# Patient Record
Sex: Female | Born: 1960 | Race: White | Hispanic: No | Marital: Married | Smoking: Never smoker
Health system: Southern US, Community
[De-identification: ages and names within clinical notes are randomized; demographics above are authoritative.]

## PROBLEM LIST (undated history)

## (undated) DIAGNOSIS — F419 Anxiety disorder, unspecified: Secondary | ICD-10-CM

## (undated) DIAGNOSIS — Z780 Asymptomatic menopausal state: Secondary | ICD-10-CM

## (undated) DIAGNOSIS — R001 Bradycardia, unspecified: Secondary | ICD-10-CM

## (undated) DIAGNOSIS — I452 Bifascicular block: Secondary | ICD-10-CM

## (undated) DIAGNOSIS — I4510 Unspecified right bundle-branch block: Secondary | ICD-10-CM

## (undated) HISTORY — DX: Asymptomatic menopausal state: Z78.0

## (undated) HISTORY — DX: Bifascicular block: I45.2

## (undated) HISTORY — DX: Unspecified right bundle-branch block: I45.10

## (undated) HISTORY — DX: Anxiety disorder, unspecified: F41.9

## (undated) HISTORY — DX: Bradycardia, unspecified: R00.1

---

## 1998-06-14 ENCOUNTER — Other Ambulatory Visit: Admission: RE | Admit: 1998-06-14 | Discharge: 1998-06-14 | Payer: Self-pay | Admitting: Obstetrics and Gynecology

## 1999-07-25 ENCOUNTER — Other Ambulatory Visit: Admission: RE | Admit: 1999-07-25 | Discharge: 1999-07-25 | Payer: Self-pay | Admitting: Obstetrics and Gynecology

## 1999-08-01 ENCOUNTER — Encounter: Payer: Self-pay | Admitting: Family Medicine

## 1999-08-01 ENCOUNTER — Encounter: Admission: RE | Admit: 1999-08-01 | Discharge: 1999-08-01 | Payer: Self-pay | Admitting: Family Medicine

## 2000-07-18 ENCOUNTER — Encounter: Admission: RE | Admit: 2000-07-18 | Discharge: 2000-07-18 | Payer: Self-pay | Admitting: Obstetrics and Gynecology

## 2000-07-18 ENCOUNTER — Encounter: Payer: Self-pay | Admitting: Obstetrics and Gynecology

## 2000-09-19 ENCOUNTER — Other Ambulatory Visit: Admission: RE | Admit: 2000-09-19 | Discharge: 2000-09-19 | Payer: Self-pay | Admitting: Obstetrics and Gynecology

## 2002-05-20 ENCOUNTER — Encounter: Admission: RE | Admit: 2002-05-20 | Discharge: 2002-05-20 | Payer: Self-pay | Admitting: Obstetrics and Gynecology

## 2002-05-20 ENCOUNTER — Encounter: Payer: Self-pay | Admitting: Obstetrics and Gynecology

## 2002-06-01 ENCOUNTER — Other Ambulatory Visit: Admission: RE | Admit: 2002-06-01 | Discharge: 2002-06-01 | Payer: Self-pay | Admitting: Obstetrics and Gynecology

## 2003-06-15 ENCOUNTER — Other Ambulatory Visit: Admission: RE | Admit: 2003-06-15 | Discharge: 2003-06-15 | Payer: Self-pay | Admitting: Obstetrics and Gynecology

## 2003-10-22 ENCOUNTER — Encounter: Admission: RE | Admit: 2003-10-22 | Discharge: 2003-10-22 | Payer: Self-pay | Admitting: Obstetrics and Gynecology

## 2004-07-25 ENCOUNTER — Other Ambulatory Visit: Admission: RE | Admit: 2004-07-25 | Discharge: 2004-07-25 | Payer: Self-pay | Admitting: Obstetrics and Gynecology

## 2004-11-08 ENCOUNTER — Encounter: Admission: RE | Admit: 2004-11-08 | Discharge: 2004-11-08 | Payer: Self-pay | Admitting: Obstetrics and Gynecology

## 2005-08-23 ENCOUNTER — Other Ambulatory Visit: Admission: RE | Admit: 2005-08-23 | Discharge: 2005-08-23 | Payer: Self-pay | Admitting: Obstetrics and Gynecology

## 2005-11-26 ENCOUNTER — Encounter: Admission: RE | Admit: 2005-11-26 | Discharge: 2005-11-26 | Payer: Self-pay | Admitting: Obstetrics and Gynecology

## 2006-09-03 HISTORY — PX: VEIN SURGERY: SHX48

## 2006-12-03 ENCOUNTER — Encounter: Admission: RE | Admit: 2006-12-03 | Discharge: 2006-12-03 | Payer: Self-pay | Admitting: Obstetrics and Gynecology

## 2006-12-11 ENCOUNTER — Encounter: Admission: RE | Admit: 2006-12-11 | Discharge: 2006-12-11 | Payer: Self-pay | Admitting: Obstetrics and Gynecology

## 2007-04-23 ENCOUNTER — Encounter: Admission: RE | Admit: 2007-04-23 | Discharge: 2007-04-23 | Payer: Self-pay | Admitting: Obstetrics and Gynecology

## 2008-01-22 ENCOUNTER — Encounter: Admission: RE | Admit: 2008-01-22 | Discharge: 2008-01-22 | Payer: Self-pay | Admitting: Obstetrics and Gynecology

## 2009-01-24 ENCOUNTER — Encounter: Admission: RE | Admit: 2009-01-24 | Discharge: 2009-01-24 | Payer: Self-pay | Admitting: Student

## 2010-01-26 ENCOUNTER — Encounter: Admission: RE | Admit: 2010-01-26 | Discharge: 2010-01-26 | Payer: Self-pay | Admitting: Obstetrics and Gynecology

## 2010-09-24 ENCOUNTER — Encounter: Payer: Self-pay | Admitting: Obstetrics and Gynecology

## 2010-12-06 ENCOUNTER — Other Ambulatory Visit: Payer: Self-pay | Admitting: Obstetrics and Gynecology

## 2010-12-06 DIAGNOSIS — Z1231 Encounter for screening mammogram for malignant neoplasm of breast: Secondary | ICD-10-CM

## 2011-01-31 ENCOUNTER — Ambulatory Visit
Admission: RE | Admit: 2011-01-31 | Discharge: 2011-01-31 | Disposition: A | Discharge status: Home / Self Care | Payer: 59 | Source: Ambulatory Visit | Attending: Obstetrics and Gynecology | Admitting: Obstetrics and Gynecology

## 2011-01-31 DIAGNOSIS — Z1231 Encounter for screening mammogram for malignant neoplasm of breast: Secondary | ICD-10-CM

## 2011-11-27 ENCOUNTER — Other Ambulatory Visit: Payer: Self-pay | Admitting: Obstetrics and Gynecology

## 2011-11-27 DIAGNOSIS — Z1231 Encounter for screening mammogram for malignant neoplasm of breast: Secondary | ICD-10-CM

## 2012-02-07 ENCOUNTER — Ambulatory Visit
Admission: RE | Admit: 2012-02-07 | Discharge: 2012-02-07 | Disposition: A | Discharge status: Home / Self Care | Payer: No Typology Code available for payment source | Source: Ambulatory Visit | Attending: Obstetrics and Gynecology | Admitting: Obstetrics and Gynecology

## 2012-02-07 DIAGNOSIS — Z1231 Encounter for screening mammogram for malignant neoplasm of breast: Secondary | ICD-10-CM

## 2012-02-12 ENCOUNTER — Other Ambulatory Visit: Payer: Self-pay | Admitting: Obstetrics and Gynecology

## 2012-02-12 DIAGNOSIS — R928 Other abnormal and inconclusive findings on diagnostic imaging of breast: Secondary | ICD-10-CM

## 2012-02-21 ENCOUNTER — Ambulatory Visit
Admission: RE | Admit: 2012-02-21 | Discharge: 2012-02-21 | Disposition: A | Discharge status: Home / Self Care | Payer: No Typology Code available for payment source | Source: Ambulatory Visit | Attending: Obstetrics and Gynecology | Admitting: Obstetrics and Gynecology

## 2012-02-21 DIAGNOSIS — R928 Other abnormal and inconclusive findings on diagnostic imaging of breast: Secondary | ICD-10-CM

## 2012-09-09 ENCOUNTER — Other Ambulatory Visit: Payer: Self-pay | Admitting: Family Medicine

## 2012-09-09 DIAGNOSIS — F458 Other somatoform disorders: Secondary | ICD-10-CM

## 2012-09-15 ENCOUNTER — Ambulatory Visit
Admission: RE | Admit: 2012-09-15 | Discharge: 2012-09-15 | Disposition: A | Discharge status: Home / Self Care | Payer: No Typology Code available for payment source | Source: Ambulatory Visit | Attending: Family Medicine | Admitting: Family Medicine

## 2012-09-15 DIAGNOSIS — F458 Other somatoform disorders: Secondary | ICD-10-CM

## 2013-01-15 ENCOUNTER — Other Ambulatory Visit: Payer: Self-pay

## 2013-01-15 DIAGNOSIS — Z1231 Encounter for screening mammogram for malignant neoplasm of breast: Secondary | ICD-10-CM

## 2013-02-19 ENCOUNTER — Ambulatory Visit
Admission: RE | Admit: 2013-02-19 | Discharge: 2013-02-19 | Disposition: A | Discharge status: Home / Self Care | Payer: PRIVATE HEALTH INSURANCE | Source: Ambulatory Visit

## 2013-02-19 ENCOUNTER — Ambulatory Visit: Payer: No Typology Code available for payment source

## 2013-02-19 DIAGNOSIS — Z1231 Encounter for screening mammogram for malignant neoplasm of breast: Secondary | ICD-10-CM

## 2013-12-15 ENCOUNTER — Other Ambulatory Visit: Payer: Self-pay

## 2013-12-15 DIAGNOSIS — Z1231 Encounter for screening mammogram for malignant neoplasm of breast: Secondary | ICD-10-CM

## 2014-02-22 ENCOUNTER — Encounter (INDEPENDENT_AMBULATORY_CARE_PROVIDER_SITE_OTHER): Payer: Self-pay

## 2014-02-22 ENCOUNTER — Ambulatory Visit
Admission: RE | Admit: 2014-02-22 | Discharge: 2014-02-22 | Disposition: A | Discharge status: Home / Self Care | Payer: PRIVATE HEALTH INSURANCE | Source: Ambulatory Visit

## 2014-02-22 DIAGNOSIS — Z1231 Encounter for screening mammogram for malignant neoplasm of breast: Secondary | ICD-10-CM

## 2015-02-09 ENCOUNTER — Other Ambulatory Visit: Payer: Self-pay

## 2015-02-09 DIAGNOSIS — Z1231 Encounter for screening mammogram for malignant neoplasm of breast: Secondary | ICD-10-CM

## 2015-03-08 ENCOUNTER — Ambulatory Visit
Admission: RE | Admit: 2015-03-08 | Discharge: 2015-03-08 | Disposition: A | Discharge status: Home / Self Care | Payer: PRIVATE HEALTH INSURANCE | Source: Ambulatory Visit

## 2015-03-08 DIAGNOSIS — Z1231 Encounter for screening mammogram for malignant neoplasm of breast: Secondary | ICD-10-CM

## 2016-01-26 ENCOUNTER — Other Ambulatory Visit: Payer: Self-pay

## 2016-01-26 DIAGNOSIS — Z1231 Encounter for screening mammogram for malignant neoplasm of breast: Secondary | ICD-10-CM

## 2016-03-12 ENCOUNTER — Ambulatory Visit
Admission: RE | Admit: 2016-03-12 | Discharge: 2016-03-12 | Disposition: A | Discharge status: Home / Self Care | Payer: PRIVATE HEALTH INSURANCE | Source: Ambulatory Visit

## 2016-03-12 DIAGNOSIS — Z1231 Encounter for screening mammogram for malignant neoplasm of breast: Secondary | ICD-10-CM

## 2016-05-01 ENCOUNTER — Telehealth: Payer: Self-pay | Admitting: Cardiology

## 2016-05-01 NOTE — Telephone Encounter (Signed)
Received records from [facility] for appointment on 06/04/16 with Dr Hochrein.  Records given to N Hines (medical records) for Dr Hochrein's schedule on 06/04/16. lp °

## 2016-05-04 ENCOUNTER — Encounter: Payer: Self-pay | Admitting: Internal Medicine

## 2016-05-10 ENCOUNTER — Ambulatory Visit (INDEPENDENT_AMBULATORY_CARE_PROVIDER_SITE_OTHER): Payer: PRIVATE HEALTH INSURANCE | Admitting: Internal Medicine

## 2016-05-10 VITALS — BP 126/78 | HR 57 | Ht 65.5 in | Wt 120.6 lb

## 2016-05-10 DIAGNOSIS — I495 Sick sinus syndrome: Secondary | ICD-10-CM | POA: Diagnosis not present

## 2016-05-10 NOTE — Progress Notes (Signed)
      HPI [name] is referred today for evaluation of sinus node dysfunction and RBBB. She is well and healthy and has no family h/o needing a PPM. She has never had syncope and has only rare palpitations. She has noted that her resting HR is low and in her primary MD's office was 46/min. She works out several times a week and has not noted any changes in her ability to exercise.  Allergies  Allergen Reactions  . Rabies Vaccines Anaphylaxis     Current Outpatient Prescriptions  Medication Sig Dispense Refill  . ALPRAZolam (XANAX) 0.5 MG tablet Take 0.5 mg by mouth 3 (three) times daily as needed for anxiety.    . Multiple Vitamin (MULTIVITAMIN) tablet Take 1 tablet by mouth daily.     No current facility-administered medications for this visit.      Past Medical History:  Diagnosis Date  . Anxiety   . Bifascicular bundle branch block   . Bradycardia   . Post-menopausal   . RBBB     ROS:   All systems reviewed and negative except as noted in the HPI.   Past Surgical History:  Procedure Laterality Date  . CESAREAN SECTION    . VEIN SURGERY  2008     Family History  Problem Relation Age of Onset  . Skin cancer Father   . Multiple myeloma Father   . Prostate cancer Father      Social History   Social History  . Marital status: Married    Spouse name: N/A  . Number of children: N/A  . Years of education: N/A   Occupational History  . Not on file.   Social History Main Topics  . Smoking status: Never Smoker  . Smokeless tobacco: Never Used  . Alcohol use 0.6 - 1.2 oz/week    1 - 2 Glasses of wine per week  . Drug use: No  . Sexual activity: Not on file   Other Topics Concern  . Not on file   Social History Narrative  . No narrative on file     BP 126/78   Pulse (!) 57   Ht 5' 5.5" (1.664 m)   Wt 120 lb 9.6 oz (54.7 kg)   BMI 19.76 kg/m   Physical Exam:  Well appearing middle aged woman, NAD HEENT: Unremarkable Neck:  No JVD, no  thyromegally Lymphatics:  No adenopathy Back:  No CVA tenderness Lungs:  Clear HEART:  Regular rate rhythm, no murmurs, no rubs, no clicks Abd:  soft, positive bowel sounds, no organomegally, no rebound, no guarding Ext:  2 plus pulses, no edema, no cyanosis, no clubbing Skin:  No rashes no nodules Neuro:  CN II through XII intact, motor grossly intact  EKG - sinus brady with RBBB and left axis.  Assess/Plan: 1. Sinus bradycardia/RBBB/left axis - the patient has evidence of conduction system disease but is asymptomatic. I have recommended she undergo exercise testing ( to assess her maximal HR and to evaluated AV conduction) and watchful waiting. We discussed symptoms suggestive of progression of disease - syncope, dizziness, inability to exercise. We will see her back based on her exercise test. I am not inclined to do additional testing at this point - Holter or echo as she is not symptomatic.   Mikle Bosworth.D.

## 2016-05-10 NOTE — Patient Instructions (Signed)
Medication Instructions:  Your physician recommends that you continue on your current medications as directed. Please refer to the Current Medication list given to you today.  Labwork: None Ordered    Testing/Procedures: Your physician has requested that you have an exercise tolerance test. For further information please visit [email or web address]. Please also follow instruction sheet, as given.     Follow-Up: Your physician recommends that you schedule a follow-up appointment on day of treadmill.    Any Other Special Instructions Will Be Listed Below (If Applicable).     If you need a refill on your cardiac medications before your next appointment, please call your pharmacy.

## 2016-05-22 ENCOUNTER — Ambulatory Visit: Payer: PRIVATE HEALTH INSURANCE | Admitting: Internal Medicine

## 2016-05-22 ENCOUNTER — Ambulatory Visit (INDEPENDENT_AMBULATORY_CARE_PROVIDER_SITE_OTHER): Payer: PRIVATE HEALTH INSURANCE

## 2016-05-22 DIAGNOSIS — I495 Sick sinus syndrome: Secondary | ICD-10-CM

## 2016-06-03 LAB — EXERCISE TOLERANCE TEST
CHL CUP MPHR: 165 {beats}/min
CHL CUP RESTING HR STRESS: 47 {beats}/min
CSEPPHR: 130 {beats}/min
Estimated workload: 13.7 METS
Exercise duration (min): 12 min
Exercise duration (sec): 0 s
Percent HR: 78 %
RPE: 15

## 2016-06-04 ENCOUNTER — Ambulatory Visit: Payer: PRIVATE HEALTH INSURANCE | Admitting: Cardiology

## 2017-02-25 ENCOUNTER — Other Ambulatory Visit: Payer: Self-pay | Admitting: Obstetrics and Gynecology

## 2017-02-25 DIAGNOSIS — Z1231 Encounter for screening mammogram for malignant neoplasm of breast: Secondary | ICD-10-CM

## 2017-03-22 ENCOUNTER — Ambulatory Visit
Admission: RE | Admit: 2017-03-22 | Discharge: 2017-03-22 | Disposition: A | Discharge status: Home / Self Care | Payer: PRIVATE HEALTH INSURANCE | Source: Ambulatory Visit | Attending: Obstetrics and Gynecology | Admitting: Obstetrics and Gynecology

## 2017-03-22 DIAGNOSIS — Z1231 Encounter for screening mammogram for malignant neoplasm of breast: Secondary | ICD-10-CM

## 2018-02-14 ENCOUNTER — Other Ambulatory Visit: Payer: Self-pay | Admitting: Obstetrics and Gynecology

## 2018-02-14 DIAGNOSIS — Z1231 Encounter for screening mammogram for malignant neoplasm of breast: Secondary | ICD-10-CM

## 2018-04-02 ENCOUNTER — Encounter: Payer: Self-pay | Admitting: Radiology

## 2018-04-02 ENCOUNTER — Ambulatory Visit
Admission: RE | Admit: 2018-04-02 | Discharge: 2018-04-02 | Disposition: A | Discharge status: Home / Self Care | Payer: PRIVATE HEALTH INSURANCE | Source: Ambulatory Visit | Attending: Obstetrics and Gynecology | Admitting: Obstetrics and Gynecology

## 2018-04-02 DIAGNOSIS — Z1231 Encounter for screening mammogram for malignant neoplasm of breast: Secondary | ICD-10-CM

## 2018-08-15 ENCOUNTER — Encounter (INDEPENDENT_AMBULATORY_CARE_PROVIDER_SITE_OTHER): Payer: Self-pay | Admitting: Orthopaedic Surgery

## 2018-08-15 ENCOUNTER — Ambulatory Visit (INDEPENDENT_AMBULATORY_CARE_PROVIDER_SITE_OTHER): Payer: PRIVATE HEALTH INSURANCE

## 2018-08-15 ENCOUNTER — Ambulatory Visit (INDEPENDENT_AMBULATORY_CARE_PROVIDER_SITE_OTHER): Payer: PRIVATE HEALTH INSURANCE | Admitting: Orthopaedic Surgery

## 2018-08-15 DIAGNOSIS — M79604 Pain in right leg: Secondary | ICD-10-CM

## 2018-08-15 NOTE — Progress Notes (Signed)
Office Visit Note   Patient: Megan Neal           Date of Birth: 09/01/1961           MRN: [id number] Visit Date: 08/15/2018              Requested by: Gaynelle Arabian, MD [address] [place], [place] [address] PCP: Gaynelle Arabian, MD   Assessment & Plan: Visit Diagnoses:  1. Pain in right leg     Plan: Impression is right lower leg bursitis and overuse doubt stress fracture and exertional compartment syndrome.  I recommend relative rest and nonimpact exercises as well as ice and heat and NSAIDs as needed.  I recommend avoidance of running for a month and then she may resume as tolerated.  If she does not improve from these measures we may need to consider advanced imaging.  Questions encouraged and answered.  She will be in touch with [place] if she does not improve.  Follow-Up Instructions: Return if symptoms worsen or fail to improve.   Orders:  Orders Placed This Encounter  Procedures  . XR Tibia/Fibula Right   No orders of the defined types were placed in this encounter.     Procedures: No procedures performed   Clinical Data: No additional findings.   Subjective: Chief Complaint  Patient presents with  . Right Leg - Pain    Megan Neal is a very pleasant 57 year old female who is the wife of Dr. Erline Levine who comes in with 1 to 2 weeks of right lower leg pain and mild swelling.  She has been running about 3 to 4 miles exercise on a regular basis.  The pain is mainly localized to the midshaft of the anterior medial aspect of the lower leg.  She states that the pain waxes and wanes and does not necessarily increase with increased activity and running.  She denies any numbness and tingling and denies any pain along the muscular compartments.  She states that originally her calf was painful but the pain migrated to the anterior medial aspect of the tibia.  She does notice some associated swelling in this area and some tenderness to palpation.  Rest does  improve the pain and running does make it worse.  Spitting and elliptical do not cause any pain.   Review of Systems  Constitutional: Negative.   HENT: Negative.   Eyes: Negative.   Respiratory: Negative.   Cardiovascular: Negative.   Endocrine: Negative.   Musculoskeletal: Negative.   Neurological: Negative.   Hematological: Negative.   Psychiatric/Behavioral: Negative.   All other systems reviewed and are negative.    Objective: Vital Signs: LMP 01/16/2011   Physical Exam Vitals signs and nursing note reviewed.  Constitutional:      Appearance: She is well-developed.  HENT:     Head: Normocephalic and atraumatic.  Neck:     Musculoskeletal: Neck supple.  Pulmonary:     Effort: Pulmonary effort is normal.  Abdominal:     Palpations: Abdomen is soft.  Skin:    General: Skin is warm.     Capillary Refill: Capillary refill takes less than 2 seconds.  Neurological:     Mental Status: She is alert and oriented to person, place, and time.  Psychiatric:        Behavior: Behavior normal.        Thought Content: Thought content normal.        Judgment: Judgment normal.     Ortho  Exam Right lower leg exam shows normal range of motion of the knee and ankle without pain.  Her muscular compartments are unremarkable.  Her pain and mild swelling are limited to the midshaft region of the anteromedial aspect of the tibia.  Her tibial crest is nontender. Specialty Comments:  No specialty comments available.  Imaging: Xr Tibia/fibula Right  Result Date: 08/15/2018 No acute or structural abnormalities.    PMFS History: Patient Active Problem List   Diagnosis Date Noted  . Sinus node dysfunction ([place]) 05/10/2016   Past Medical History:  Diagnosis Date  . Anxiety   . Bifascicular bundle branch block   . Bradycardia   . Post-menopausal   . RBBB     Family History  Problem Relation Age of Onset  . Skin cancer Father   . Multiple myeloma Father   . Prostate cancer  Father   . Breast cancer Maternal Aunt   . Breast cancer Maternal Aunt   . Breast cancer Maternal Aunt     Past Surgical History:  Procedure Laterality Date  . CESAREAN SECTION    . VEIN SURGERY  2008   Social History   Occupational History  . Not on file  Tobacco Use  . Smoking status: Never Smoker  . Smokeless tobacco: Never Used  Substance and Sexual Activity  . Alcohol use: Yes    Alcohol/week: 1.0 - 2.0 standard drinks    Types: 1 - 2 Glasses of wine per week  . Drug use: No  . Sexual activity: Not on file

## 2019-02-11 ENCOUNTER — Ambulatory Visit (INDEPENDENT_AMBULATORY_CARE_PROVIDER_SITE_OTHER): Payer: PRIVATE HEALTH INSURANCE

## 2019-02-11 ENCOUNTER — Encounter: Payer: Self-pay | Admitting: Orthopaedic Surgery

## 2019-02-11 ENCOUNTER — Ambulatory Visit (INDEPENDENT_AMBULATORY_CARE_PROVIDER_SITE_OTHER): Payer: PRIVATE HEALTH INSURANCE | Admitting: Orthopaedic Surgery

## 2019-02-11 ENCOUNTER — Other Ambulatory Visit: Payer: Self-pay

## 2019-02-11 DIAGNOSIS — S62347A Nondisplaced fracture of base of fifth metacarpal bone. left hand, initial encounter for closed fracture: Secondary | ICD-10-CM | POA: Diagnosis not present

## 2019-02-11 DIAGNOSIS — M79642 Pain in left hand: Secondary | ICD-10-CM | POA: Diagnosis not present

## 2019-02-11 NOTE — Progress Notes (Signed)
Office Visit Note   Patient: Megan Neal           Date of Birth: 04-Sep-1960           MRN: [id number] Visit Date: 02/11/2019              Requested by: Gaynelle Arabian, MD [address] [place], [place] [address] PCP: Gaynelle Arabian, MD   Assessment & Plan: Visit Diagnoses:  1. Nondisplaced fracture of base of fifth metacarpal bone, left hand, initial encounter for closed fracture     Plan: Impression is a minimally displaced base of fifth metacarpal fracture.  X-rays were reviewed with the patient today and overall the alignment is within acceptable limits and this should heal predictably over the next 8 to 12 weeks.  We will place her in a removable ulnar gutter brace for the next 4 weeks.  We will see her back in 4 weeks with three-view x-rays of the left hand.  Should she need stronger pain medicines for breakthrough pain she is welcome to call us at any time I am happy to send some in for her.  Questions encouraged and answered.  Follow-Up Instructions: Return in about 4 weeks (around 03/11/2019).   Orders:  Orders Placed This Encounter  Procedures  . XR Hand Complete Left   No orders of the defined types were placed in this encounter.     Procedures: No procedures performed   Clinical Data: No additional findings.   Subjective: Chief Complaint  Patient presents with  . Left Hand - Pain    Megan Neal is a 58 year old female who is the wife of Erline Levine who comes in for evaluation of her acute left hand injury that she sustained yesterday evening and she had a mechanical fall during her walk in the neighborhood.  She did hit her shoulder and right hand and her head but she is doing fine from this and has mainly complaints the left hand.  She does have swelling and bruising to the left hand.  She has been taking over-the-counter medicines only.   Review of Systems  Constitutional: Negative.   HENT: Negative.   Eyes: Negative.   Respiratory:  Negative.   Cardiovascular: Negative.   Endocrine: Negative.   Musculoskeletal: Negative.   Neurological: Negative.   Hematological: Negative.   Psychiatric/Behavioral: Negative.   All other systems reviewed and are negative.    Objective: Vital Signs: LMP 01/16/2011   Physical Exam Vitals signs and nursing note reviewed.  Constitutional:      Appearance: She is well-developed.  HENT:     Head: Normocephalic and atraumatic.  Neck:     Musculoskeletal: Neck supple.  Pulmonary:     Effort: Pulmonary effort is normal.  Abdominal:     Palpations: Abdomen is soft.  Skin:    General: Skin is warm.     Capillary Refill: Capillary refill takes less than 2 seconds.  Neurological:     Mental Status: She is alert and oriented to person, place, and time.  Psychiatric:        Behavior: Behavior normal.        Thought Content: Thought content normal.        Judgment: Judgment normal.     Ortho Exam Left hand exam shows moderate swelling and ecchymosis.  No rotational deformity of the fifth ray.  No neurovascular compromise of the left hand.  She is tender at the base of the fifth metacarpal.  No gross  movement of the fifth CMC articulation. Specialty Comments:  No specialty comments available.  Imaging: Xr Hand Complete Left  Result Date: 02/11/2019 Minimally displaced base of 5th metacarpal fracture.  CMC articulation intact.    PMFS History: Patient Active Problem List   Diagnosis Date Noted  . Sinus node dysfunction ([place]) 05/10/2016   Past Medical History:  Diagnosis Date  . Anxiety   . Bifascicular bundle branch block   . Bradycardia   . Post-menopausal   . RBBB     Family History  Problem Relation Age of Onset  . Skin cancer Father   . Multiple myeloma Father   . Prostate cancer Father   . Breast cancer Maternal Aunt   . Breast cancer Maternal Aunt   . Breast cancer Maternal Aunt     Past Surgical History:  Procedure Laterality Date  . CESAREAN SECTION     . VEIN SURGERY  2008   Social History   Occupational History  . Not on file  Tobacco Use  . Smoking status: Never Smoker  . Smokeless tobacco: Never Used  Substance and Sexual Activity  . Alcohol use: Yes    Alcohol/week: 1.0 - 2.0 standard drinks    Types: 1 - 2 Glasses of wine per week  . Drug use: No  . Sexual activity: Not on file

## 2019-03-05 ENCOUNTER — Other Ambulatory Visit: Payer: Self-pay | Admitting: Obstetrics and Gynecology

## 2019-03-05 DIAGNOSIS — Z1231 Encounter for screening mammogram for malignant neoplasm of breast: Secondary | ICD-10-CM

## 2019-04-17 ENCOUNTER — Ambulatory Visit: Payer: PRIVATE HEALTH INSURANCE

## 2019-05-21 ENCOUNTER — Ambulatory Visit
Admission: RE | Admit: 2019-05-21 | Discharge: 2019-05-21 | Disposition: A | Discharge status: Home / Self Care | Payer: PRIVATE HEALTH INSURANCE | Source: Ambulatory Visit | Attending: Obstetrics and Gynecology | Admitting: Obstetrics and Gynecology

## 2019-05-21 ENCOUNTER — Other Ambulatory Visit: Payer: Self-pay

## 2019-05-21 DIAGNOSIS — Z1231 Encounter for screening mammogram for malignant neoplasm of breast: Secondary | ICD-10-CM

## 2020-05-17 ENCOUNTER — Other Ambulatory Visit: Payer: Self-pay | Admitting: Obstetrics and Gynecology

## 2020-05-17 DIAGNOSIS — Z1231 Encounter for screening mammogram for malignant neoplasm of breast: Secondary | ICD-10-CM

## 2020-05-27 ENCOUNTER — Ambulatory Visit
Admission: RE | Admit: 2020-05-27 | Discharge: 2020-05-27 | Disposition: A | Discharge status: Home / Self Care | Payer: PRIVATE HEALTH INSURANCE | Source: Ambulatory Visit | Attending: Obstetrics and Gynecology | Admitting: Obstetrics and Gynecology

## 2020-05-27 ENCOUNTER — Other Ambulatory Visit: Payer: Self-pay

## 2020-05-27 DIAGNOSIS — Z1231 Encounter for screening mammogram for malignant neoplasm of breast: Secondary | ICD-10-CM

## 2021-05-18 ENCOUNTER — Other Ambulatory Visit: Payer: Self-pay | Admitting: Obstetrics and Gynecology

## 2021-05-18 DIAGNOSIS — Z1231 Encounter for screening mammogram for malignant neoplasm of breast: Secondary | ICD-10-CM

## 2021-06-28 ENCOUNTER — Ambulatory Visit
Admission: RE | Admit: 2021-06-28 | Discharge: 2021-06-28 | Disposition: A | Discharge status: Home / Self Care | Payer: No Typology Code available for payment source | Source: Ambulatory Visit | Attending: Obstetrics and Gynecology | Admitting: Obstetrics and Gynecology

## 2021-06-28 ENCOUNTER — Other Ambulatory Visit: Payer: Self-pay

## 2021-06-28 DIAGNOSIS — Z1231 Encounter for screening mammogram for malignant neoplasm of breast: Secondary | ICD-10-CM

## 2022-05-09 ENCOUNTER — Other Ambulatory Visit: Payer: Self-pay | Admitting: Obstetrics and Gynecology

## 2022-05-09 ENCOUNTER — Other Ambulatory Visit: Payer: Self-pay | Admitting: Family Medicine

## 2022-05-09 DIAGNOSIS — Z1231 Encounter for screening mammogram for malignant neoplasm of breast: Secondary | ICD-10-CM

## 2022-06-23 DIAGNOSIS — Z23 Encounter for immunization: Secondary | ICD-10-CM | POA: Diagnosis not present

## 2022-07-09 ENCOUNTER — Ambulatory Visit: Payer: No Typology Code available for payment source

## 2022-07-16 ENCOUNTER — Ambulatory Visit
Admission: RE | Admit: 2022-07-16 | Discharge: 2022-07-16 | Disposition: A | Discharge status: Home / Self Care | Payer: BC Managed Care – PPO | Source: Ambulatory Visit | Attending: Obstetrics and Gynecology | Admitting: Obstetrics and Gynecology

## 2022-07-16 DIAGNOSIS — Z1231 Encounter for screening mammogram for malignant neoplasm of breast: Secondary | ICD-10-CM | POA: Diagnosis not present

## 2022-07-25 DIAGNOSIS — Z131 Encounter for screening for diabetes mellitus: Secondary | ICD-10-CM | POA: Diagnosis not present

## 2022-07-25 DIAGNOSIS — E785 Hyperlipidemia, unspecified: Secondary | ICD-10-CM | POA: Diagnosis not present

## 2022-07-25 DIAGNOSIS — Z0000 Encounter for general adult medical examination without abnormal findings: Secondary | ICD-10-CM | POA: Diagnosis not present

## 2022-10-10 DIAGNOSIS — Z01419 Encounter for gynecological examination (general) (routine) without abnormal findings: Secondary | ICD-10-CM | POA: Diagnosis not present

## 2022-10-10 DIAGNOSIS — Z1151 Encounter for screening for human papillomavirus (HPV): Secondary | ICD-10-CM | POA: Diagnosis not present

## 2022-10-10 DIAGNOSIS — Z6821 Body mass index (BMI) 21.0-21.9, adult: Secondary | ICD-10-CM | POA: Diagnosis not present

## 2022-10-10 DIAGNOSIS — Z124 Encounter for screening for malignant neoplasm of cervix: Secondary | ICD-10-CM | POA: Diagnosis not present

## 2022-11-22 DIAGNOSIS — Z13820 Encounter for screening for osteoporosis: Secondary | ICD-10-CM | POA: Diagnosis not present

## 2023-01-01 DIAGNOSIS — D225 Melanocytic nevi of trunk: Secondary | ICD-10-CM | POA: Diagnosis not present

## 2023-01-01 DIAGNOSIS — D2362 Other benign neoplasm of skin of left upper limb, including shoulder: Secondary | ICD-10-CM | POA: Diagnosis not present

## 2023-01-01 DIAGNOSIS — L821 Other seborrheic keratosis: Secondary | ICD-10-CM | POA: Diagnosis not present

## 2023-01-01 DIAGNOSIS — L578 Other skin changes due to chronic exposure to nonionizing radiation: Secondary | ICD-10-CM | POA: Diagnosis not present

## 2023-03-17 ENCOUNTER — Emergency Department (HOSPITAL_BASED_OUTPATIENT_CLINIC_OR_DEPARTMENT_OTHER): Payer: BC Managed Care – PPO | Admitting: Radiology

## 2023-03-17 ENCOUNTER — Emergency Department (HOSPITAL_BASED_OUTPATIENT_CLINIC_OR_DEPARTMENT_OTHER)
Admission: EM | Admit: 2023-03-17 | Discharge: 2023-03-17 | Disposition: A | Discharge status: Home / Self Care | Payer: BC Managed Care – PPO | Attending: Emergency Medicine | Admitting: Emergency Medicine

## 2023-03-17 ENCOUNTER — Other Ambulatory Visit: Payer: Self-pay

## 2023-03-17 ENCOUNTER — Encounter (HOSPITAL_BASED_OUTPATIENT_CLINIC_OR_DEPARTMENT_OTHER): Payer: Self-pay | Admitting: Emergency Medicine

## 2023-03-17 DIAGNOSIS — J168 Pneumonia due to other specified infectious organisms: Secondary | ICD-10-CM | POA: Insufficient documentation

## 2023-03-17 DIAGNOSIS — Z20822 Contact with and (suspected) exposure to covid-19: Secondary | ICD-10-CM | POA: Insufficient documentation

## 2023-03-17 DIAGNOSIS — J189 Pneumonia, unspecified organism: Secondary | ICD-10-CM

## 2023-03-17 DIAGNOSIS — R059 Cough, unspecified: Secondary | ICD-10-CM | POA: Diagnosis not present

## 2023-03-17 DIAGNOSIS — R918 Other nonspecific abnormal finding of lung field: Secondary | ICD-10-CM | POA: Diagnosis not present

## 2023-03-17 LAB — RESP PANEL BY RT-PCR (RSV, FLU A&B, COVID)  RVPGX2
Influenza A by PCR: NEGATIVE
Influenza B by PCR: NEGATIVE
Resp Syncytial Virus by PCR: NEGATIVE
SARS Coronavirus 2 by RT PCR: NEGATIVE

## 2023-03-17 MED ORDER — BENZONATATE 100 MG PO CAPS: 100.0000 mg | ORAL_CAPSULE | Freq: Three times a day (TID) | ORAL | 0 refills | Status: AC

## 2023-03-17 MED ORDER — AZITHROMYCIN 250 MG PO TABS: 250.0000 mg | ORAL_TABLET | Freq: Every day | ORAL | 0 refills | Status: AC

## 2023-03-17 NOTE — Discharge Instructions (Signed)
It was a pleasure taking care of you here in the emergency department  As discussed in the room your chest x-ray shows a right lower lobe pneumonia.    We have started you on antibiotic called Azithromycin.  Please fully complete this antibiotics until they are no more pills left in the prescription.    We have also prescribed you Tessalon Perles which will help with your cough.  May also use a humidifier at night to help with cough as well.  May take Tylenol or Motrin as needed for pain/fever  Make sure to follow-up with primary care provider in 1 week, if you are feeling worse after 2 days of antibiotics, start coughing up blood, unilateral leg swelling, chest pain, persistent high fever, nausea or vomiting please seek reevaluation emergency department

## 2023-03-17 NOTE — ED Triage Notes (Signed)
Pt arrives to ED with c/o cough x4 days. Was recently in [place] and arrived home x2 days ago. Associated with headache and fever.

## 2023-03-17 NOTE — ED Provider Notes (Signed)
[facility] AT [facility] Provider Note   CSN: [id number] Arrival date & time: 03/17/23  1245    History  Chief Complaint  Patient presents with   Cough    Megan Neal is a 62 y.o. female history of RBBB here for evaluation of cough.  Began 4 days ago.  Some hoarse voice.  Has had some night sweats, chills as well as headaches.  No rhinorrhea.  No neck rigidity.  No chest pain.  Has some shortness of breath when she coughs however no otherwise DOE.  No abdominal pain.  No nausea or vomiting.  No pain or swelling to lower legs.  No history of PE or DVT.  Did return 2 days ago from [place] however symptoms started prior to this according to husband.  She denies any hemoptysis, pleuritic chest pain.  Feels like when she has had pneumonia previously.  She has been visiting phone number in the hospital as well as nursing home.  HPI     Home Medications Prior to Admission medications   Medication Sig Start Date End Date Taking? Authorizing Provider  azithromycin (ZITHROMAX) 250 MG tablet Take 1 tablet (250 mg total) by mouth daily. Take first 2 tablets together, then 1 every day until finished. 03/17/23  Yes Charna Neeb A, PA-C  benzonatate (TESSALON) 100 MG capsule Take 1 capsule (100 mg total) by mouth every 8 (eight) hours. 03/17/23  Yes Marquisha Nikolov A, PA-C  ALPRAZolam (XANAX) 0.5 MG tablet Take 0.5 mg by mouth 3 (three) times daily as needed for anxiety.    [provider]  Multiple Vitamin (MULTIVITAMIN) tablet Take 1 tablet by mouth daily.    [provider]      Allergies    Rabies immune globulin (human) and Rabies vaccines    Review of Systems   Review of Systems  Constitutional:  Positive for chills and fatigue.  HENT:         Hoarse voice  Respiratory:  Positive for cough and shortness of breath (when coughing).   Cardiovascular: Negative.   Gastrointestinal: Negative.   Genitourinary: Negative.    Musculoskeletal: Negative.   Skin: Negative.   Neurological:  Positive for headaches.  All other systems reviewed and are negative.   Physical Exam Updated Vital Signs BP 122/70 (BP Location: Right Arm)   Pulse 67   Temp 97.9 F (36.6 C) (Oral)   Resp 16   Ht 5\' 5"  (1.651 m)   Wt 54.4 kg   LMP 01/16/2011   SpO2 98%   BMI 19.97 kg/m  Physical Exam Vitals and nursing note reviewed.  Constitutional:      General: She is not in acute distress.    Appearance: She is well-developed. She is not ill-appearing, toxic-appearing or diaphoretic.  HENT:     Head: Normocephalic and atraumatic.     Nose: Nose normal.     Mouth/Throat:     Lips: Pink.     Mouth: Mucous membranes are moist.     Pharynx: Oropharynx is clear. Uvula midline.     Comments: Posterior oropharynx clear.  Uvula midline.  No deviation.  No exudates.  No pooling of secretions Eyes:     Pupils: Pupils are equal, round, and reactive to light.  Cardiovascular:     Rate and Rhythm: Normal rate.     Pulses: Normal pulses.          Radial pulses are 2+ on the right side and 2+ on the  left side.     Heart sounds: Normal heart sounds.  Pulmonary:     Effort: Pulmonary effort is normal. No respiratory distress.     Breath sounds: Rhonchi present.     Comments: Rhonchi posterior right lower lobe.  No respiratory distress Chest:     Chest wall: No tenderness.  Abdominal:     General: Bowel sounds are normal. There is no distension.     Palpations: Abdomen is soft.     Tenderness: There is no abdominal tenderness.  Musculoskeletal:        General: No swelling, tenderness, deformity or signs of injury. Normal range of motion.     Cervical back: Normal range of motion.     Right lower leg: No edema.     Left lower leg: No edema.     Comments: Soft, nontender, Comparments soft  Skin:    General: Skin is warm and dry.     Capillary Refill: Capillary refill takes less than 2 seconds.  Neurological:     General: No  focal deficit present.     Mental Status: She is alert.     Gait: Gait normal.  Psychiatric:        Mood and Affect: Mood normal.     ED Results / Procedures / Treatments   Labs (all labs ordered are listed, but only abnormal results are displayed) Labs Reviewed  RESP PANEL BY RT-PCR (RSV, FLU A&B, COVID)  RVPGX2    EKG None  Radiology DG Chest 2 View  Result Date: 03/17/2023 CLINICAL DATA:  Cough x4 days EXAM: CHEST - 2 VIEW COMPARISON:  None Available. FINDINGS: Cardiac size is within normal limits. There are no signs of alveolar pulmonary edema. Patchy infiltrates are seen in posterior right lower lung fields suggesting right lower lobe pneumonia. There is no significant pleural effusion or pneumothorax. IMPRESSION: Moderate-sized patchy infiltrate is seen in right lower lung field consistent with pneumonia. Electronically Signed   By: Ernie Avena M.D.   On: 03/17/2023 13:50    Procedures Procedures    Medications Ordered in ED Medications - No data to display  ED Course/ Medical Decision Making/ A&P   This is a pleasant 62 year old here for evaluation of cough, chills, hoarse voice and headache which started 4 days ago.  Afebrile, nonseptic, not ill-appearing.  Rhonchi to right lower lobe.  No respiratory distress.  Her abdomen is soft, nontender.  Posterior pharynx clear.  No evidence of PTA or RPA.  Low suspicion for strep pharyngitis.  She moves her neck freely in the room.  Low suspicion for meningitis.  Of note she did recently return from [place] 2 days ago however according to patient and significant other in room her symptoms started prior to her travel.  She is no clinical evidence of VTE on exam, no pain, swelling, redness.  She has no hemoptysis, pleuritic chest pain, tachycardia, tachypnea or hypoxia. Given symptoms started prior to her travel I suspect this is most likely infectious in nature and I have low suspicion for pulmonary embolism as cause of her  cough.  She states symptoms today feel similar to when she has had pneumonia previously.  Labs and imaging personally viewed and interpreted:  Viral panel negative Chest x-ray with patchy lobe right lower lobe  I discussed results with patient, and significant other in room.  Will start on azithromycin for CAP.  She does have exposure to visiting someone in the hospital as well as a nursing home however  no inpatient hospitalization. Do not feel she needs HCAP coverage at this time. Low suspicion for aspiration pneumonia.  Will have her follow-up with PCP for reevaluation, return for new or worsening symptoms.  Discussed plan with patient, husband in room.  Agreeable for follow-up, return for new or worsening symptoms.  The patient has been appropriately medically screened and/or stabilized in the ED. I have low suspicion for any other emergent medical condition which would require further screening, evaluation or treatment in the ED or require inpatient management.  Patient is hemodynamically stable and in no acute distress.  Patient able to ambulate in department prior to ED.  Evaluation does not show acute pathology that would require ongoing or additional emergent interventions while in the emergency department or further inpatient treatment.  I have discussed the diagnosis with the patient and answered all questions.  Pain is been managed while in the emergency department and patient has no further complaints prior to discharge.  Patient is comfortable with plan discussed in room and is stable for discharge at this time.  I have discussed strict return precautions for returning to the emergency department.  Patient was encouraged to follow-up with PCP/specialist refer to at discharge.                             Medical Decision Making Amount and/or Complexity of Data Reviewed Independent Historian: spouse External Data Reviewed: labs, radiology and notes. Labs: ordered. Decision-making  details documented in ED Course. Radiology: ordered and independent interpretation performed. Decision-making details documented in ED Course.  Risk OTC drugs. Prescription drug management. Decision regarding hospitalization. Diagnosis or treatment significantly limited by social determinants of health.          Final Clinical Impression(s) / ED Diagnoses Final diagnoses:  Pneumonia of right lower lobe due to infectious organism    Rx / DC Orders ED Discharge Orders          Ordered    azithromycin (ZITHROMAX) 250 MG tablet  Daily        03/17/23 1425    benzonatate (TESSALON) 100 MG capsule  Every 8 hours        03/17/23 1425              Lorimer Tiberio A, PA-C 03/17/23 1526    Ernie Avena, MD 03/18/23 [id number]

## 2023-03-20 DIAGNOSIS — J189 Pneumonia, unspecified organism: Secondary | ICD-10-CM | POA: Diagnosis not present

## 2023-06-13 ENCOUNTER — Other Ambulatory Visit: Payer: Self-pay | Admitting: Obstetrics and Gynecology

## 2023-06-13 DIAGNOSIS — Z1231 Encounter for screening mammogram for malignant neoplasm of breast: Secondary | ICD-10-CM

## 2023-06-15 DIAGNOSIS — Z23 Encounter for immunization: Secondary | ICD-10-CM | POA: Diagnosis not present

## 2023-06-18 DIAGNOSIS — F418 Other specified anxiety disorders: Secondary | ICD-10-CM | POA: Diagnosis not present

## 2023-08-20 ENCOUNTER — Ambulatory Visit
Admission: RE | Admit: 2023-08-20 | Discharge: 2023-08-20 | Disposition: A | Discharge status: Home / Self Care | Payer: BC Managed Care – PPO | Source: Ambulatory Visit | Attending: Obstetrics and Gynecology | Admitting: Obstetrics and Gynecology

## 2023-08-20 DIAGNOSIS — Z1231 Encounter for screening mammogram for malignant neoplasm of breast: Secondary | ICD-10-CM | POA: Diagnosis not present

## 2023-08-30 DIAGNOSIS — Z131 Encounter for screening for diabetes mellitus: Secondary | ICD-10-CM | POA: Diagnosis not present

## 2023-08-30 DIAGNOSIS — F418 Other specified anxiety disorders: Secondary | ICD-10-CM | POA: Diagnosis not present

## 2023-08-30 DIAGNOSIS — E782 Mixed hyperlipidemia: Secondary | ICD-10-CM | POA: Diagnosis not present

## 2023-08-30 DIAGNOSIS — Z0000 Encounter for general adult medical examination without abnormal findings: Secondary | ICD-10-CM | POA: Diagnosis not present

## 2023-10-11 DIAGNOSIS — R350 Frequency of micturition: Secondary | ICD-10-CM | POA: Diagnosis not present

## 2023-10-14 DIAGNOSIS — Z6821 Body mass index (BMI) 21.0-21.9, adult: Secondary | ICD-10-CM | POA: Diagnosis not present

## 2023-10-14 DIAGNOSIS — Z124 Encounter for screening for malignant neoplasm of cervix: Secondary | ICD-10-CM | POA: Diagnosis not present

## 2023-10-14 DIAGNOSIS — Z01419 Encounter for gynecological examination (general) (routine) without abnormal findings: Secondary | ICD-10-CM | POA: Diagnosis not present

## 2023-10-14 DIAGNOSIS — N760 Acute vaginitis: Secondary | ICD-10-CM | POA: Diagnosis not present

## 2023-10-14 DIAGNOSIS — Z1151 Encounter for screening for human papillomavirus (HPV): Secondary | ICD-10-CM | POA: Diagnosis not present

## 2023-10-23 IMAGING — MG MM DIGITAL SCREENING BILAT W/ TOMO AND CAD
8 series · 9 of 24 positions shown · non-contrast
Comparison: Previous exam(s).

CLINICAL DATA: Screening.

EXAM:
DIGITAL SCREENING BILATERAL MAMMOGRAM WITH TOMOSYNTHESIS AND CAD
TECHNIQUE: Bilateral screening digital craniocaudal and mediolateral oblique
mammograms were obtained. Bilateral screening digital breast
tomosynthesis was performed. The images were evaluated with
computer-aided detection.

[R CC synth-2D]
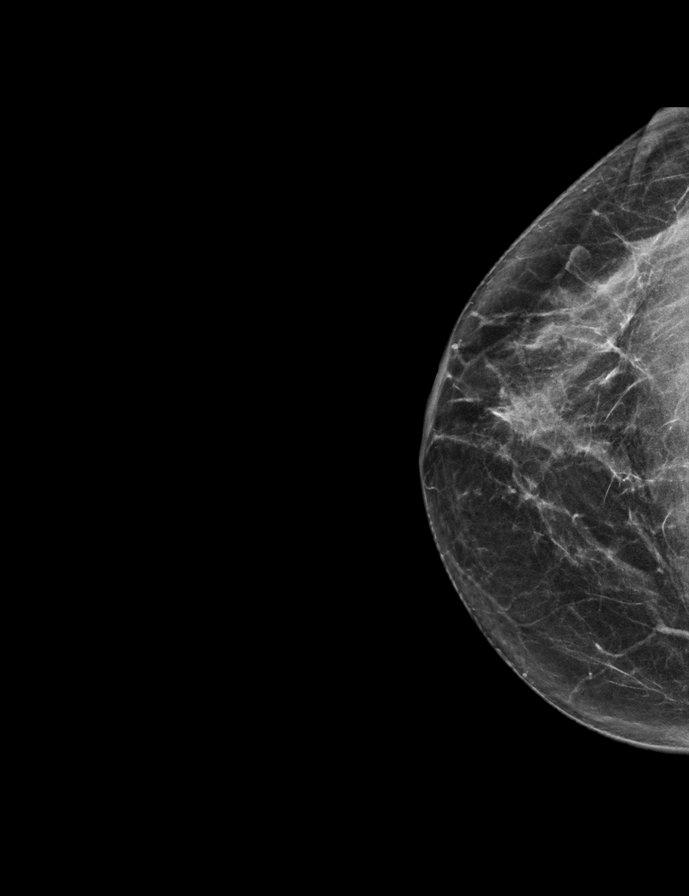

[L MLO synth-2D]
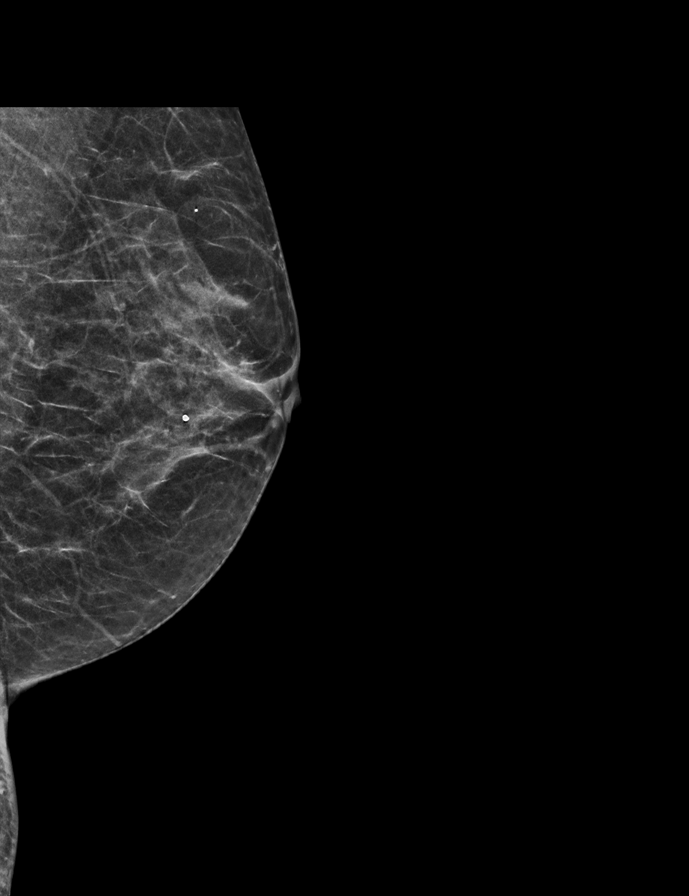

[L CC synth-2D]
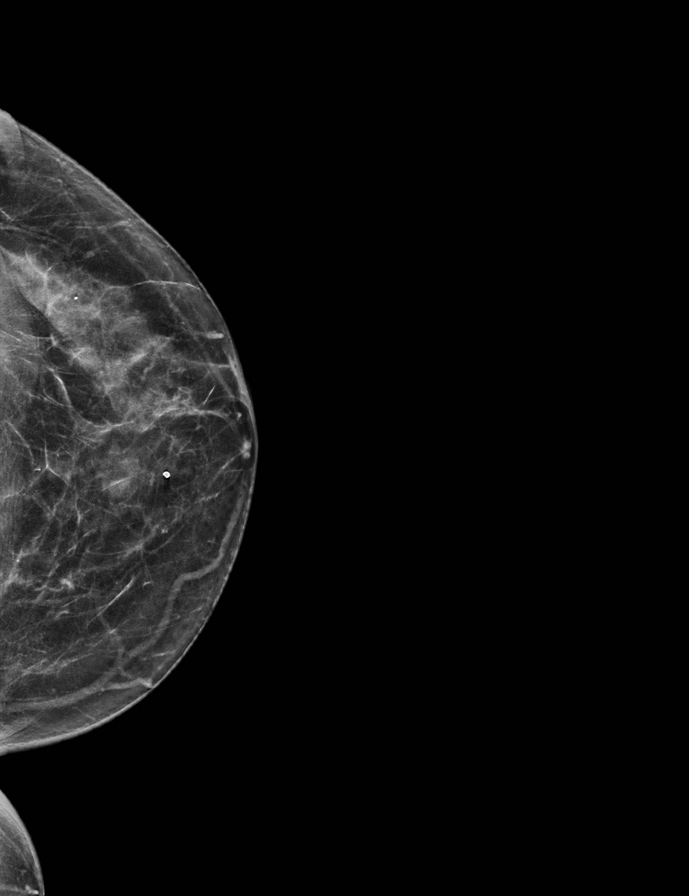

[R MLO synth-2D]
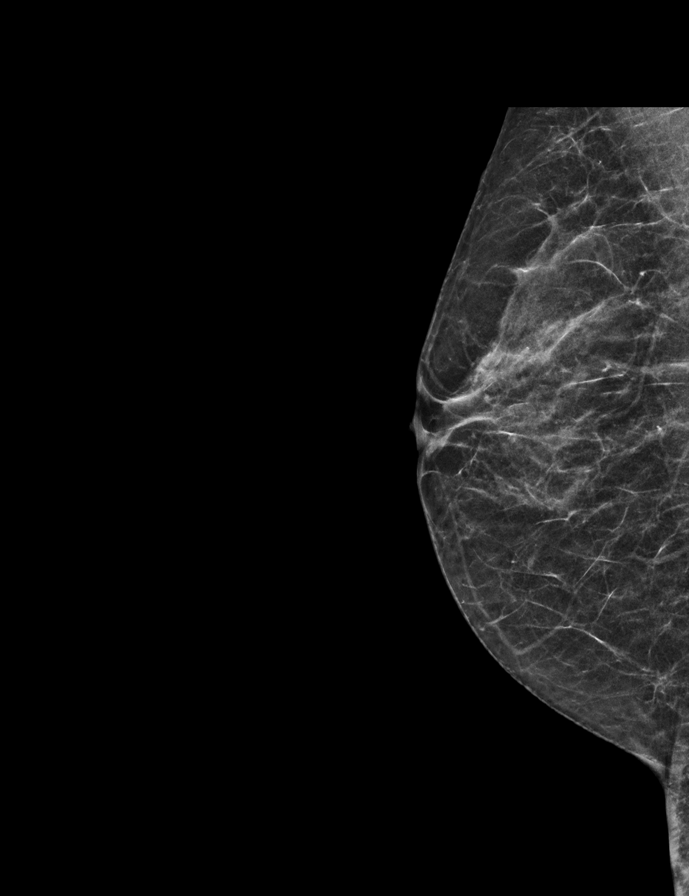

[R CC tomo · 2 of 53 frames shown]
[frame 18/53]
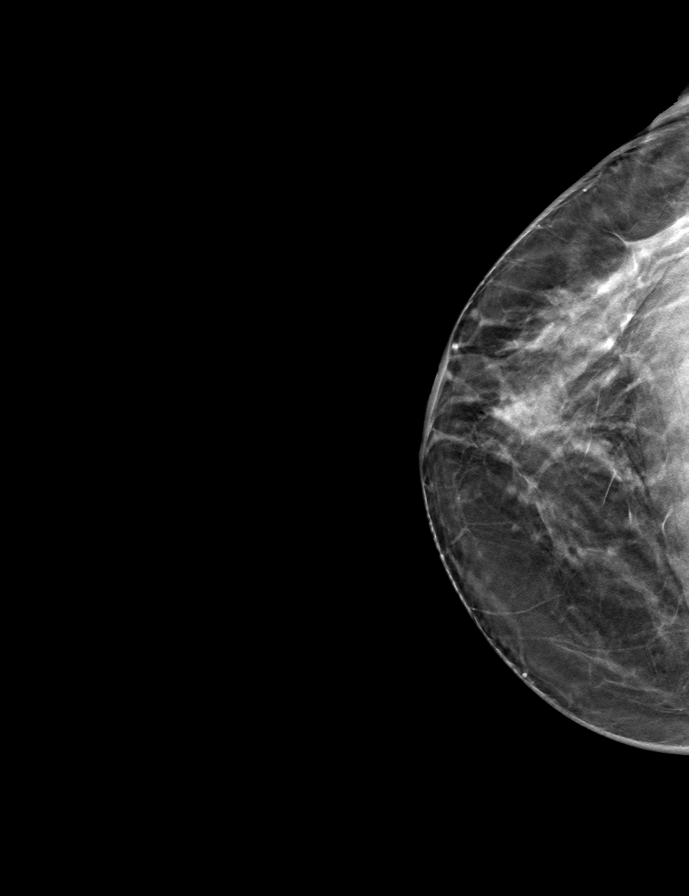
[frame 27/53]
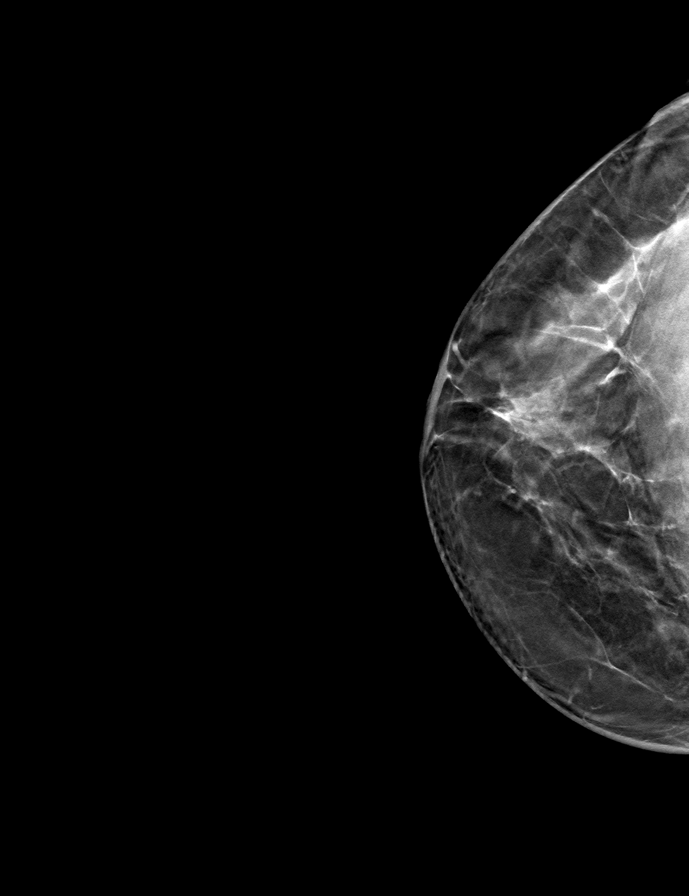

[L CC tomo · tomo slice 27/52.0]
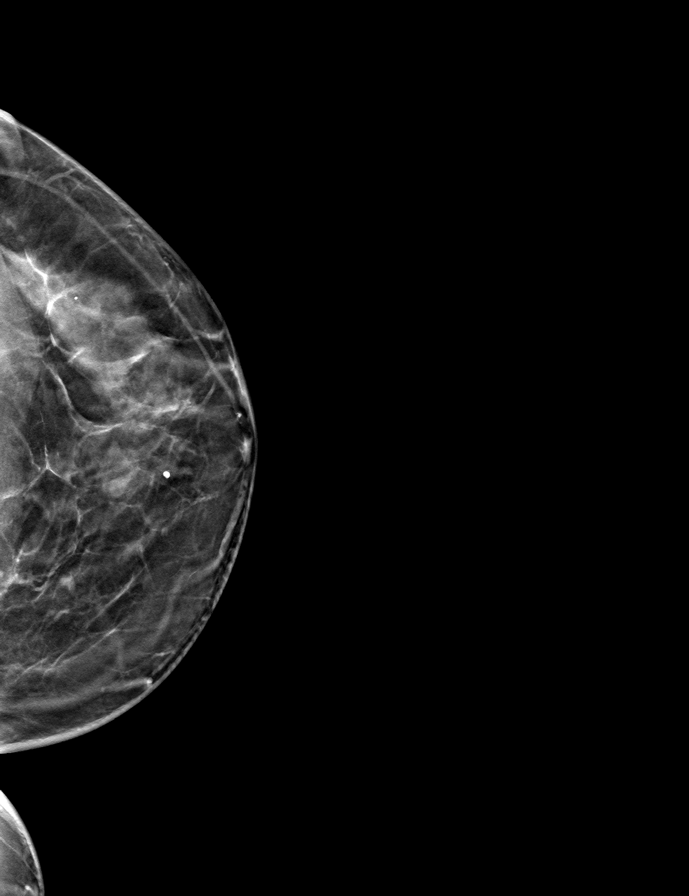

[R MLO tomo · tomo slice 21/42.0]
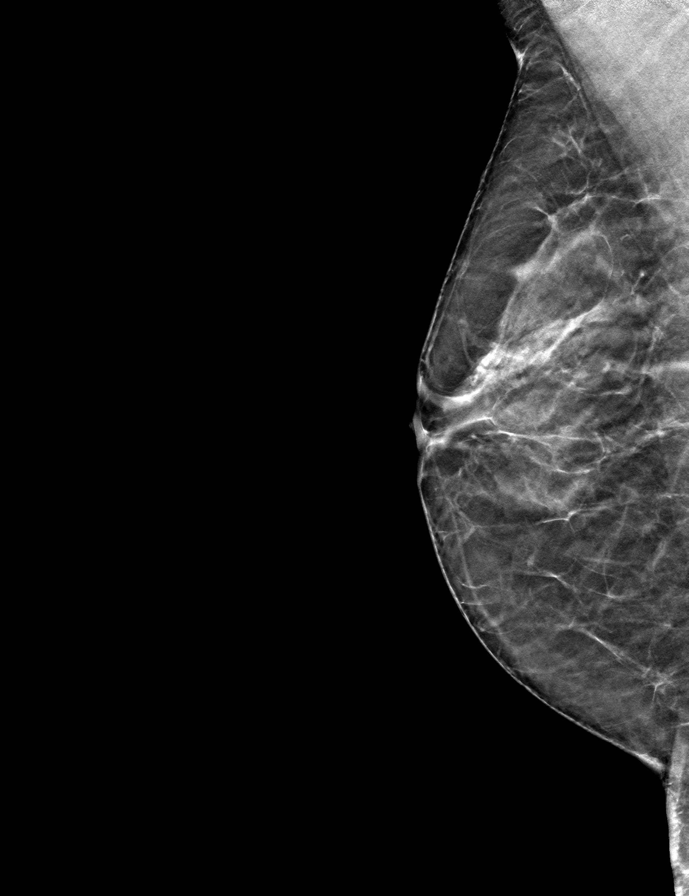

[L MLO tomo · tomo slice 21/42.0]
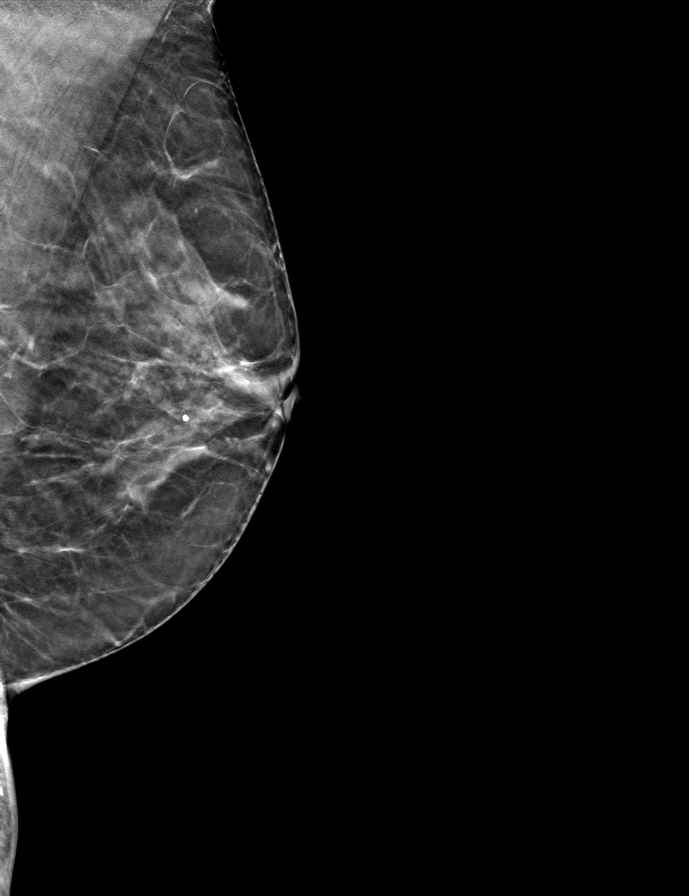

[9 of 24 positions shown; findings below may reference images not displayed]

ACR Breast Density Category b: There are scattered areas of
fibroglandular density.
FINDINGS: There are no findings suspicious for malignancy.
IMPRESSION: No mammographic evidence of malignancy. A result letter of this
screening mammogram will be mailed directly to the patient.

RECOMMENDATION:
Screening mammogram in one year. (Code:51-O-LD2)

BI-RADS CATEGORY  1: Negative.

## 2023-10-30 DIAGNOSIS — Z23 Encounter for immunization: Secondary | ICD-10-CM | POA: Diagnosis not present

## 2024-01-14 DIAGNOSIS — D225 Melanocytic nevi of trunk: Secondary | ICD-10-CM | POA: Diagnosis not present

## 2024-01-14 DIAGNOSIS — L578 Other skin changes due to chronic exposure to nonionizing radiation: Secondary | ICD-10-CM | POA: Diagnosis not present

## 2024-01-14 DIAGNOSIS — L821 Other seborrheic keratosis: Secondary | ICD-10-CM | POA: Diagnosis not present

## 2024-01-14 DIAGNOSIS — L818 Other specified disorders of pigmentation: Secondary | ICD-10-CM | POA: Diagnosis not present

## 2024-04-16 DIAGNOSIS — H2513 Age-related nuclear cataract, bilateral: Secondary | ICD-10-CM | POA: Diagnosis not present

## 2024-04-16 DIAGNOSIS — H04123 Dry eye syndrome of bilateral lacrimal glands: Secondary | ICD-10-CM | POA: Diagnosis not present

## 2024-04-16 DIAGNOSIS — H5203 Hypermetropia, bilateral: Secondary | ICD-10-CM | POA: Diagnosis not present

## 2024-06-09 DIAGNOSIS — Z1211 Encounter for screening for malignant neoplasm of colon: Secondary | ICD-10-CM | POA: Diagnosis not present

## 2024-06-09 DIAGNOSIS — D122 Benign neoplasm of ascending colon: Secondary | ICD-10-CM | POA: Diagnosis not present

## 2024-06-09 DIAGNOSIS — K648 Other hemorrhoids: Secondary | ICD-10-CM | POA: Diagnosis not present

## 2024-08-06 ENCOUNTER — Other Ambulatory Visit: Payer: Self-pay | Admitting: Obstetrics and Gynecology

## 2024-08-06 DIAGNOSIS — Z1231 Encounter for screening mammogram for malignant neoplasm of breast: Secondary | ICD-10-CM

## 2024-09-07 ENCOUNTER — Ambulatory Visit

## 2024-09-23 ENCOUNTER — Inpatient Hospital Stay
Admission: RE | Admit: 2024-09-23 | Discharge: 2024-09-23 | Attending: Obstetrics and Gynecology | Admitting: Obstetrics and Gynecology

## 2024-09-23 DIAGNOSIS — Z1231 Encounter for screening mammogram for malignant neoplasm of breast: Secondary | ICD-10-CM
